# Patient Record
Sex: Male | Born: 2000 | Race: Black or African American | Hispanic: No | Marital: Single
Health system: Southern US, Community
[De-identification: ages and names within clinical notes are randomized; demographics above are authoritative.]

## PROBLEM LIST (undated history)

## (undated) DIAGNOSIS — R143 Flatulence: Secondary | ICD-10-CM

## (undated) DIAGNOSIS — R142 Eructation: Secondary | ICD-10-CM

## (undated) DIAGNOSIS — R141 Gas pain: Secondary | ICD-10-CM

## (undated) HISTORY — DX: Eructation: R14.2

## (undated) HISTORY — DX: Gas pain: R14.1

## (undated) HISTORY — DX: Flatulence: R14.3

---
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
## 2019-02-09 ENCOUNTER — Other Ambulatory Visit: Payer: Self-pay

## 2019-02-09 DIAGNOSIS — Z20822 Contact with and (suspected) exposure to covid-19: Secondary | ICD-10-CM

## 2019-02-13 LAB — NOVEL CORONAVIRUS, NAA: SARS-CoV-2, NAA: NOT DETECTED

## 2019-02-17 ENCOUNTER — Telehealth: Payer: Self-pay | Admitting: Family Medicine

## 2019-02-17 NOTE — Telephone Encounter (Signed)
Patient and mom called and he received his results
